# Patient Record
Sex: Female | Born: 1990 | Race: Black or African American | Hispanic: No | Marital: Single | Smoking: Never smoker
Health system: Southern US, Community
[De-identification: ages and names within clinical notes are randomized; demographics above are authoritative.]

## PROBLEM LIST (undated history)

## (undated) HISTORY — PX: NO PAST SURGERIES: SHX2092

---
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
## 2009-03-20 ENCOUNTER — Emergency Department (HOSPITAL_COMMUNITY): Admission: EM | Admit: 2009-03-20 | Discharge: 2009-03-20 | Payer: Self-pay | Admitting: Emergency Medicine

## 2009-07-05 ENCOUNTER — Emergency Department (HOSPITAL_COMMUNITY): Admission: EM | Admit: 2009-07-05 | Discharge: 2009-07-05 | Payer: Self-pay | Admitting: Emergency Medicine

## 2010-09-18 ENCOUNTER — Emergency Department (HOSPITAL_COMMUNITY)
Admission: EM | Admit: 2010-09-18 | Discharge: 2010-09-18 | Payer: Self-pay | Source: Home / Self Care | Admitting: Family Medicine

## 2011-01-07 LAB — URINALYSIS, ROUTINE W REFLEX MICROSCOPIC
Bilirubin Urine: NEGATIVE
Glucose, UA: NEGATIVE mg/dL
Hgb urine dipstick: NEGATIVE
Ketones, ur: NEGATIVE mg/dL
Nitrite: NEGATIVE
Protein, ur: NEGATIVE mg/dL
Specific Gravity, Urine: 1.025 (ref 1.005–1.030)
Urobilinogen, UA: 0.2 mg/dL (ref 0.0–1.0)
pH: 6.5 (ref 5.0–8.0)

## 2011-01-07 LAB — POCT I-STAT, CHEM 8
BUN: 9 mg/dL (ref 6–23)
Creatinine, Ser: 0.7 mg/dL (ref 0.4–1.2)
Hemoglobin: 13.9 g/dL (ref 12.0–15.0)
Potassium: 3.9 mEq/L (ref 3.5–5.1)
Sodium: 138 mEq/L (ref 135–145)

## 2011-01-07 LAB — POCT PREGNANCY, URINE: Preg Test, Ur: NEGATIVE

## 2012-02-24 ENCOUNTER — Emergency Department (HOSPITAL_COMMUNITY): Payer: BC Managed Care – PPO

## 2012-02-24 ENCOUNTER — Encounter (HOSPITAL_COMMUNITY): Payer: Self-pay

## 2012-02-24 ENCOUNTER — Emergency Department (HOSPITAL_COMMUNITY)
Admission: EM | Admit: 2012-02-24 | Discharge: 2012-02-24 | Disposition: A | Discharge status: Home / Self Care | Payer: BC Managed Care – PPO | Attending: Emergency Medicine | Admitting: Emergency Medicine

## 2012-02-24 DIAGNOSIS — R51 Headache: Secondary | ICD-10-CM | POA: Insufficient documentation

## 2012-02-24 DIAGNOSIS — IMO0002 Reserved for concepts with insufficient information to code with codable children: Secondary | ICD-10-CM | POA: Insufficient documentation

## 2012-02-24 DIAGNOSIS — S40812A Abrasion of left upper arm, initial encounter: Secondary | ICD-10-CM

## 2012-02-24 DIAGNOSIS — T07XXXA Unspecified multiple injuries, initial encounter: Secondary | ICD-10-CM | POA: Insufficient documentation

## 2012-02-24 DIAGNOSIS — M542 Cervicalgia: Secondary | ICD-10-CM | POA: Insufficient documentation

## 2012-02-24 DIAGNOSIS — M25519 Pain in unspecified shoulder: Secondary | ICD-10-CM | POA: Insufficient documentation

## 2012-02-24 MED ORDER — OXYCODONE-ACETAMINOPHEN 5-325 MG PO TABS
2.0000 | ORAL_TABLET | Freq: Once | ORAL | Status: AC
  Administered 2012-02-24: 2 via ORAL
  Filled 2012-02-24: qty 2

## 2012-02-24 MED ORDER — TETANUS-DIPHTH-ACELL PERTUSSIS 5-2.5-18.5 LF-MCG/0.5 IM SUSP
0.5000 mL | Freq: Once | INTRAMUSCULAR | Status: AC
  Administered 2012-02-24: 0.5 mL via INTRAMUSCULAR
  Filled 2012-02-24: qty 0.5

## 2012-02-24 MED ORDER — OXYCODONE-ACETAMINOPHEN 5-325 MG PO TABS: 1.0000 | ORAL_TABLET | ORAL | Status: AC | PRN
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
## 2012-02-24 NOTE — ED Notes (Signed)
Pt in radiology 
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
## 2019-04-11 ENCOUNTER — Ambulatory Visit: Payer: Self-pay | Admitting: Nurse Practitioner

## 2019-04-11 ENCOUNTER — Other Ambulatory Visit: Payer: Self-pay

## 2019-04-20 ENCOUNTER — Ambulatory Visit: Payer: Self-pay | Attending: Nurse Practitioner | Admitting: Nurse Practitioner

## 2019-04-20 ENCOUNTER — Encounter: Payer: Self-pay | Admitting: Nurse Practitioner

## 2019-04-20 ENCOUNTER — Other Ambulatory Visit: Payer: Self-pay

## 2019-04-20 ENCOUNTER — Other Ambulatory Visit: Payer: Self-pay | Admitting: Internal Medicine

## 2019-04-20 DIAGNOSIS — Z13220 Encounter for screening for lipoid disorders: Secondary | ICD-10-CM

## 2019-04-20 DIAGNOSIS — F17210 Nicotine dependence, cigarettes, uncomplicated: Secondary | ICD-10-CM

## 2019-04-20 DIAGNOSIS — Z7689 Persons encountering health services in other specified circumstances: Secondary | ICD-10-CM

## 2019-04-20 DIAGNOSIS — Z20822 Contact with and (suspected) exposure to covid-19: Secondary | ICD-10-CM

## 2019-04-20 DIAGNOSIS — Z130 Encounter for screening for diseases of the blood and blood-forming organs and certain disorders involving the immune mechanism: Secondary | ICD-10-CM

## 2019-04-20 NOTE — Progress Notes (Signed)
Virtual Visit via Telephone Note Due to national recommendations of social distancing due to [place] 19, telehealth visit is felt to be most appropriate for this patient at this time.  I discussed the limitations, risks, security and privacy concerns of performing an evaluation and management service by telephone and the availability of in person appointments. I also discussed with the patient that there may be a patient responsible charge related to this service. The patient expressed understanding and agreed to proceed.    I connected with Tiffany Kirk on 04/20/19  at   8:50 AM EDT  EDT by telephone and verified that I am speaking with the correct person using two identifiers.   Consent I discussed the limitations, risks, security and privacy concerns of performing an evaluation and management service by telephone and the availability of in person appointments. I also discussed with the patient that there may be a patient responsible charge related to this service. The patient expressed understanding and agreed to proceed.   Location of Patient: Private Residence   Location of Provider: [place] and [place] participating in Telemedicine visit: Geryl Rankins FNP-BC [place]    History of Present Illness: Telemedicine visit for: Establish Care  She denies any PMH. She has never had a PAP smear. Menstrual cycles are normal. She is a smoker and has been a smoker for 5 years. Not ready to quit.     History reviewed. No pertinent past medical history.  Past Surgical History:  Procedure Laterality Date  . NO PAST SURGERIES      Family History  Problem Relation Age of Onset  . Diabetes Neg Hx     Social History   Socioeconomic History  . Marital status: Single    Spouse name: Not on file  . Number of children: Not on file  . Years of education: Not on file  . Highest education level: Not on file  Occupational History  .  Not on file  Social Needs  . Financial resource strain: Not on file  . Food insecurity    Worry: Not on file    Inability: Not on file  . Transportation needs    Medical: Not on file    Non-medical: Not on file  Tobacco Use  . Smoking status: Current Every Day Smoker  . Smokeless tobacco: Never Used  Substance and Sexual Activity  . Alcohol use: Yes    Comment: occasionally   . Drug use: Not Currently  . Sexual activity: Yes  Lifestyle  . Physical activity    Days per week: Not on file    Minutes per session: Not on file  . Stress: Not on file  Relationships  . Social Herbalist on phone: Not on file    Gets together: Not on file    Attends religious service: Not on file    Active member of club or organization: Not on file    Attends meetings of clubs or organizations: Not on file    Relationship status: Not on file  Other Topics Concern  . Not on file  Social History Narrative  . Not on file     Observations/Objective: Awake, alert and oriented x 3   Review of Systems  Constitutional: Negative for fever, malaise/fatigue and weight loss.  HENT: Negative.  Negative for nosebleeds.   Eyes: Negative.  Negative for blurred vision, double vision and photophobia.  Respiratory: Negative.  Negative  for cough and shortness of breath.   Cardiovascular: Negative.  Negative for chest pain, palpitations and leg swelling.  Gastrointestinal: Negative.  Negative for heartburn, nausea and vomiting.  Musculoskeletal: Negative.  Negative for myalgias.  Neurological: Negative.  Negative for dizziness, focal weakness, seizures and headaches.  Psychiatric/Behavioral: Negative.  Negative for suicidal ideas.    Assessment and Plan: Tiffany Kirk was seen today for new patient (initial visit).  Diagnoses and all orders for this visit:  Encounter to establish care -     CMP14+EGFR  Encounter for screening for lipid disorder -     Lipid panel  Screening for deficiency anemia -      CBC     Follow Up Instructions Return for Physical and PAP.     I discussed the assessment and treatment plan with the patient. The patient was provided an opportunity to ask questions and all were answered. The patient agreed with the plan and demonstrated an understanding of the instructions.   The patient was advised to call back or seek an in-person evaluation if the symptoms worsen or if the condition fails to improve as anticipated.  I provided 15 minutes of non-face-to-face time during this encounter including median intraservice time, reviewing previous notes, labs, imaging, medications and explaining diagnosis and management.  Gildardo Pounds, FNP-BC

## 2019-04-26 LAB — NOVEL CORONAVIRUS, NAA: SARS-CoV-2, NAA: NOT DETECTED

## 2019-05-08 ENCOUNTER — Encounter: Payer: Self-pay | Admitting: Nurse Practitioner

## 2019-05-15 ENCOUNTER — Encounter: Payer: Self-pay | Admitting: Nurse Practitioner

## 2019-12-19 DIAGNOSIS — Z6841 Body Mass Index (BMI) 40.0 and over, adult: Secondary | ICD-10-CM | POA: Diagnosis not present

## 2019-12-19 DIAGNOSIS — R110 Nausea: Secondary | ICD-10-CM | POA: Diagnosis not present

## 2020-03-12 ENCOUNTER — Ambulatory Visit (INDEPENDENT_AMBULATORY_CARE_PROVIDER_SITE_OTHER): Payer: BC Managed Care – PPO

## 2020-03-12 ENCOUNTER — Other Ambulatory Visit: Payer: Self-pay

## 2020-03-12 ENCOUNTER — Ambulatory Visit: Payer: BC Managed Care – PPO | Admitting: Podiatry

## 2020-03-12 DIAGNOSIS — M2142 Flat foot [pes planus] (acquired), left foot: Secondary | ICD-10-CM

## 2020-03-12 DIAGNOSIS — M79673 Pain in unspecified foot: Secondary | ICD-10-CM

## 2020-03-12 DIAGNOSIS — M2141 Flat foot [pes planus] (acquired), right foot: Secondary | ICD-10-CM

## 2020-03-13 ENCOUNTER — Encounter: Payer: Self-pay | Admitting: Podiatry

## 2020-03-13 NOTE — Progress Notes (Signed)
Subjective:  Patient ID: Tiffany Kirk, female    DOB: 12-Feb-1991,  MRN: [id number]  Chief Complaint  Patient presents with  . Foot Pain    pt is here for bi lat flat feet, pt states that pain is elevated to the touch, pt also states that the pain has been going on for 3-5 years, pt also states that the pain is gradual.    29 y.o. female presents with the above complaint.  Patient presents with complaint of bilateral arch pain as well as heel pain that has been going on for quite some time.  Patient states that he feels generalized aches especially when she has been standing on her feet for long period of time.  Is been over 3 to 5 years has progressive gotten worse.  Is painful to walk on.  Pain is gradual.  There is aching and shooting and throbbing associated with it.  She states that she works 12-hour shifts.  She would like to know if there is any treatment options for this.  She has not seen anyone else prior to seeing me.  Her pain scale is 6 out of 10.  Is dull achy in nature   Review of Systems: Negative except as noted in the HPI. Denies N/V/F/Ch.  No past medical history on file. No current outpatient medications on file.  Social History   Tobacco Use  Smoking Status Current Every Day Smoker  Smokeless Tobacco Never Used    Allergies  Allergen Reactions  . Strawberry Flavor     Bumps on face   Objective:  There were no vitals filed for this visit. There is no height or weight on file to calculate BMI. Constitutional Well developed. Well nourished.  Vascular Dorsalis pedis pulses palpable bilaterally. Posterior tibial pulses palpable bilaterally. Capillary refill normal to all digits.  No cyanosis or clubbing noted. Pedal hair growth normal.  Neurologic Normal speech. Oriented to person, place, and time. Epicritic sensation to light touch grossly present bilaterally.  Dermatologic Nails well groomed and normal in appearance. No open wounds. No skin lesions.   Orthopedic:  Generalized pain noted across the arch of the foot as well as the plantar fascia origin.  No sharp shooting pain was palpated.  Unable to recreate the arch with dorsiflexion of the hallux.  Gait examination shows calcaneal valgus with too many toe signs unable to recreate the arch with dorsiflexion of the hallux.  Unable to supinate the calcaneus with single and double heel raises.   Radiographs: 3 views of skeletally mature adult foot bilateral: There is decreasing calcaneal inclination angle increase in talar declination angle anterior break in the cyma line osteoarthritic changes noted at the talonavicular joint mild elevatus of the first ray noted.  Moderate bunion deformity noted bilaterally.  No other abnormalities noted.  Assessment:   1. Bilateral pes planus   2. Arch pain, unspecified laterality    Plan:  Patient was evaluated and treated and all questions answered.  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  I explained to her that given her severe nature of the pes planus with huge demand on her feet from work hard, I believe patient will benefit from orthotics management to help support the arches of the foot as well as control the hindfoot motion.  Patient agrees with the plan would like to proceed with getting  orthotics. -If there is no resolve meant with orthotics will consider surgical intervention at that time. -Patient will be scheduled see Raiford Noble for custom-made orthotics  Return in about 1 week (around 03/19/2020) for Sched with Raiford Noble for [employer].

## 2020-03-26 ENCOUNTER — Other Ambulatory Visit: Payer: Self-pay

## 2020-03-26 ENCOUNTER — Other Ambulatory Visit: Payer: BC Managed Care – PPO | Admitting: Orthotics

## 2020-03-26 ENCOUNTER — Ambulatory Visit (INDEPENDENT_AMBULATORY_CARE_PROVIDER_SITE_OTHER): Payer: BC Managed Care – PPO | Admitting: Orthotics

## 2020-03-26 DIAGNOSIS — M79673 Pain in unspecified foot: Secondary | ICD-10-CM

## 2020-03-26 DIAGNOSIS — M2141 Flat foot [pes planus] (acquired), right foot: Secondary | ICD-10-CM | POA: Diagnosis not present

## 2020-03-26 DIAGNOSIS — M2142 Flat foot [pes planus] (acquired), left foot: Secondary | ICD-10-CM | POA: Diagnosis not present

## 2020-03-26 NOTE — Progress Notes (Signed)
Foot orthotics cast today for pes planovalgus RF deformity; seconardy over pronation.  Plan on deep heel cup/4* b/l kirby skive.

## 2020-04-18 ENCOUNTER — Other Ambulatory Visit: Payer: Self-pay

## 2020-04-18 ENCOUNTER — Encounter: Payer: BC Managed Care – PPO | Admitting: Orthotics

## 2020-04-23 ENCOUNTER — Other Ambulatory Visit: Payer: Self-pay | Admitting: Family Medicine

## 2020-04-23 ENCOUNTER — Other Ambulatory Visit (HOSPITAL_COMMUNITY)
Admission: RE | Admit: 2020-04-23 | Discharge: 2020-04-23 | Disposition: A | Discharge status: Home / Self Care | Payer: BC Managed Care – PPO | Source: Ambulatory Visit | Attending: Family Medicine | Admitting: Family Medicine

## 2020-04-23 DIAGNOSIS — Z01411 Encounter for gynecological examination (general) (routine) with abnormal findings: Secondary | ICD-10-CM | POA: Diagnosis present

## 2020-04-28 LAB — CYTOLOGY - PAP
Adequacy: ABSENT
Diagnosis: NEGATIVE

## 2020-06-13 ENCOUNTER — Ambulatory Visit: Payer: BC Managed Care – PPO | Admitting: Podiatry

## 2020-11-19 ENCOUNTER — Ambulatory Visit: Payer: BC Managed Care – PPO | Admitting: Podiatry

## 2020-11-19 ENCOUNTER — Other Ambulatory Visit: Payer: Self-pay

## 2020-11-19 ENCOUNTER — Encounter: Payer: Self-pay | Admitting: Podiatry

## 2020-11-19 ENCOUNTER — Ambulatory Visit (INDEPENDENT_AMBULATORY_CARE_PROVIDER_SITE_OTHER): Payer: BC Managed Care – PPO

## 2020-11-19 DIAGNOSIS — M76821 Posterior tibial tendinitis, right leg: Secondary | ICD-10-CM | POA: Diagnosis not present

## 2020-11-19 DIAGNOSIS — M2141 Flat foot [pes planus] (acquired), right foot: Secondary | ICD-10-CM

## 2020-11-19 DIAGNOSIS — M2142 Flat foot [pes planus] (acquired), left foot: Secondary | ICD-10-CM | POA: Diagnosis not present

## 2020-11-19 NOTE — Progress Notes (Signed)
Subjective:  Patient ID: Tiffany Kirk, female    DOB: 1991-05-01,  MRN: [id number]  Chief Complaint  Patient presents with  . Foot Pain    Bilateral foot pain due to pes planus. Pt. States pain has increased. Per. Pt. Orthotics are not helping with pain.    30 y.o. female presents with the above complaint.  Patient presents with complaint bilateral pes planovalgus with arch pain as well as inside medial tendon pain.  Patient states that he has been on and off getting little bit better but not really overall.  She states the orthotics are helping somewhat but she has not been very compliant with it.  She has been working more on her foot.  She has not been able to rest it.  She denies any other acute complaint she would like to discuss future treatment options.   Review of Systems: Negative except as noted in the HPI. Denies N/V/F/Ch.  No past medical history on file.  Current Outpatient Medications:  .  phentermine (ADIPEX-P) 37.5 MG tablet, 1 tablet, Disp: , Rfl:  .  phentermine (ADIPEX-P) 37.5 MG tablet, Take 37.5 mg by mouth daily., Disp: , Rfl:   Social History   Tobacco Use  Smoking Status Current Every Day Smoker  Smokeless Tobacco Never Used    Allergies  Allergen Reactions  . Strawberry Flavor     Bumps on face   Objective:  There were no vitals filed for this visit. There is no height or weight on file to calculate BMI. Constitutional Well developed. Well nourished.  Vascular Dorsalis pedis pulses palpable bilaterally. Posterior tibial pulses palpable bilaterally. Capillary refill normal to all digits.  No cyanosis or clubbing noted. Pedal hair growth normal.  Neurologic Normal speech. Oriented to person, place, and time. Epicritic sensation to light touch grossly present bilaterally.  Dermatologic Nails well groomed and normal in appearance. No open wounds. No skin lesions.  Orthopedic:  Generalized pain noted across the arch of the foot as well as the  plantar fascia origin.  No sharp shooting pain was palpated.  Unable to recreate the arch with dorsiflexion of the hallux.  Gait examination shows calcaneal valgus with too many toe signs unable to recreate the arch with dorsiflexion of the hallux.  Unable to supinate the calcaneus with single and double heel raises.  Pain along the course of the posterior tibial tendon.  Mild pain along the insertion of it into the navicular.  Pain with resisted dorsiflexion inversion of the foot.  No pain with dorsiflexion eversion of the foot.  No pain at the Achilles tendon, peroneal tendon, ATFL ligament.   Radiographs: 3 views of skeletally mature adult foot bilateral: There is decreasing calcaneal inclination angle increase in talar declination angle anterior break in the cyma line osteoarthritic changes noted at the talonavicular joint mild elevatus of the first ray noted.  Moderate bunion deformity noted bilaterally.  No other abnormalities noted.  Assessment:   1. Bilateral pes planus    Plan:  Patient was evaluated and treated and all questions answered.  Bilateral posterior tibial tendinitis right greater than left with underlying pes planovalgus deformity -I explained to patient the etiology of arch pain and various treatment options were discussed in the setting of pes planovalgus foot deformity.  I also explained to her the etiology of posterior tibial tendinitis with this relationship of pes planovalgus.  She states that she has not been very compliant with the custom orthotics.  I encouraged compliancy with the  orthotics she states understanding and will do so. -Given the amount of pain she is having I believe patient will benefit from evaluation with an MRI to rule out posterior tibial tendinitis.  Patient agrees with the plan would like to proceed with getting an MRI.  We will start off with the right side for now.  No follow-ups on file.

## 2020-11-25 ENCOUNTER — Other Ambulatory Visit: Payer: Self-pay

## 2020-11-25 ENCOUNTER — Ambulatory Visit
Admission: RE | Admit: 2020-11-25 | Discharge: 2020-11-25 | Disposition: A | Discharge status: Home / Self Care | Payer: BC Managed Care – PPO | Source: Ambulatory Visit | Attending: Podiatry | Admitting: Podiatry

## 2020-11-25 DIAGNOSIS — M76821 Posterior tibial tendinitis, right leg: Secondary | ICD-10-CM

## 2020-12-17 ENCOUNTER — Ambulatory Visit: Payer: BC Managed Care – PPO | Admitting: Podiatry

## 2020-12-26 DIAGNOSIS — R5382 Chronic fatigue, unspecified: Secondary | ICD-10-CM | POA: Diagnosis not present

## 2021-01-09 ENCOUNTER — Other Ambulatory Visit: Payer: Self-pay

## 2021-01-09 ENCOUNTER — Encounter: Payer: Self-pay | Admitting: Podiatry

## 2021-01-09 ENCOUNTER — Ambulatory Visit: Payer: BC Managed Care – PPO | Admitting: Podiatry

## 2021-01-09 DIAGNOSIS — M2142 Flat foot [pes planus] (acquired), left foot: Secondary | ICD-10-CM

## 2021-01-09 DIAGNOSIS — M2141 Flat foot [pes planus] (acquired), right foot: Secondary | ICD-10-CM

## 2021-01-09 DIAGNOSIS — M76821 Posterior tibial tendinitis, right leg: Secondary | ICD-10-CM

## 2021-01-13 ENCOUNTER — Encounter: Payer: Self-pay | Admitting: Podiatry

## 2021-01-13 NOTE — Progress Notes (Signed)
Subjective:  Patient ID: Tiffany Kirk, female    DOB: [birth year],  MRN: [id number]  Chief Complaint  Patient presents with  . Foot Pain    MRI results     30 y.o. female presents with the above complaint.  Patient presents for follow-up of bilateral severe pes planovalgus with arch pain as well as posterior tibial tendinitis.  Right greater than left.  She states that the orthotics helps a little bit.  However she still continues to have pain.  She is here to go over the MRI results and discuss future treatment options.   Review of Systems: Negative except as noted in the HPI. Denies N/V/F/Ch.  No past medical history on file.  Current Outpatient Medications:  .  phentermine (ADIPEX-P) 37.5 MG tablet, Take 37.5 mg by mouth daily., Disp: , Rfl:  .  phentermine (ADIPEX-P) 37.5 MG tablet, 1 tablet, Disp: , Rfl:   Social History   Tobacco Use  Smoking Status Current Every Day Smoker  Smokeless Tobacco Never Used    Allergies  Allergen Reactions  . Strawberry Flavor     Bumps on face   Objective:  There were no vitals filed for this visit. There is no height or weight on file to calculate BMI. Constitutional Well developed. Well nourished.  Vascular Dorsalis pedis pulses palpable bilaterally. Posterior tibial pulses palpable bilaterally. Capillary refill normal to all digits.  No cyanosis or clubbing noted. Pedal hair growth normal.  Neurologic Normal speech. Oriented to person, place, and time. Epicritic sensation to light touch grossly present bilaterally.  Dermatologic Nails well groomed and normal in appearance. No open wounds. No skin lesions.  Orthopedic:  Generalized pain noted across the arch of the foot as well as the plantar fascia origin.  No sharp shooting pain was palpated.  Unable to recreate the arch with dorsiflexion of the hallux.  Gait examination shows calcaneal valgus with too many toe signs unable to recreate the arch with dorsiflexion of the  hallux.  Unable to supinate the calcaneus with single and double heel raises.  Pain along the course of the posterior tibial tendon.  Mild pain along the insertion of it into the navicular.  Pain with resisted dorsiflexion inversion of the foot.  No pain with dorsiflexion eversion of the foot.  No pain at the Achilles tendon, peroneal tendon, ATFL ligament.   Radiographs: 3 views of skeletally mature adult foot bilateral: There is decreasing calcaneal inclination angle increase in talar declination angle anterior break in the cyma line osteoarthritic changes noted at the talonavicular joint mild elevatus of the first ray noted.  Moderate bunion deformity noted bilaterally.  No other abnormalities noted.  Moderate lateral hindfoot valgus deformity with associated marrow edema in the lateral process of the talus and subjacent calcaneus.  Findings consistent with os trigonum syndrome.  Mild edema about the distal tibialis posterior tendon compatible with tendinosis. No tear.  Assessment:   1. Bilateral pes planus   2. Posterior tibial tendinitis of right leg    Plan:  Patient was evaluated and treated and all questions answered.  Bilateral posterior tibial tendinitis right greater than left with underlying pes planovalgus deformity -I explained to patient the etiology of arch pain and various treatment options were discussed in the setting of pes planovalgus foot deformity.  I also explained to her the etiology of posterior tibial tendinitis with this relationship of pes planovalgus.  She states that she has not been very compliant with the custom orthotics.  I  encouraged compliancy with the orthotics she states understanding and will do so. -MRI was discussed with the patient in extensive detail.  I discussed with the patient that although the results of the MRI were pointing towards changes secondary to severe pes planovalgus deformity.  I briefly discussed my surgical planning as well as  plan to essentially reconstruct the flatfoot.  Given the changes that she is having with hindfoot valgus deformity with pain due to os trigonum syndrome.  Pain in the arch of the foot.  At this time I believe patient is an ideal candidate for flatfoot reconstruction.  I discussed with her that it would take about 3 months of recovery.  For now patient would like to think about it.  I will see her back again in few weeks to reassess and see if she is able to undergo the surgery or not.  Ultimately believe she will benefit from this as she is already started to get changes in her MRI secondary to severe hindfoot valgus. No follow-ups on file.

## 2021-01-22 ENCOUNTER — Encounter: Payer: Self-pay | Admitting: Podiatry

## 2021-01-22 ENCOUNTER — Telehealth: Payer: Self-pay | Admitting: Podiatry

## 2021-01-22 NOTE — Telephone Encounter (Signed)
Patient is requesting that her office note form 01/09/21 be re faxed to her job stating the next appointment day. Patient stated her employer needed to know the date of next appointment. Fax # [phone]

## 2021-01-22 NOTE — Telephone Encounter (Signed)
Done

## 2021-01-22 NOTE — Telephone Encounter (Signed)
That is fine you can send it over

## 2021-02-25 ENCOUNTER — Ambulatory Visit: Payer: Self-pay | Admitting: Podiatry

## 2021-03-20 DIAGNOSIS — R5382 Chronic fatigue, unspecified: Secondary | ICD-10-CM | POA: Diagnosis not present

## 2021-08-03 DIAGNOSIS — R5382 Chronic fatigue, unspecified: Secondary | ICD-10-CM | POA: Diagnosis not present

## 2021-11-10 DIAGNOSIS — E782 Mixed hyperlipidemia: Secondary | ICD-10-CM | POA: Diagnosis not present

## 2021-11-10 DIAGNOSIS — E119 Type 2 diabetes mellitus without complications: Secondary | ICD-10-CM | POA: Diagnosis not present

## 2021-11-10 DIAGNOSIS — Z0000 Encounter for general adult medical examination without abnormal findings: Secondary | ICD-10-CM | POA: Diagnosis not present

## 2021-11-10 DIAGNOSIS — I10 Essential (primary) hypertension: Secondary | ICD-10-CM | POA: Diagnosis not present

## 2021-11-10 DIAGNOSIS — E038 Other specified hypothyroidism: Secondary | ICD-10-CM | POA: Diagnosis not present

## 2021-11-10 DIAGNOSIS — E559 Vitamin D deficiency, unspecified: Secondary | ICD-10-CM | POA: Diagnosis not present

## 2021-11-10 DIAGNOSIS — R5382 Chronic fatigue, unspecified: Secondary | ICD-10-CM | POA: Diagnosis not present

## 2021-11-10 DIAGNOSIS — D518 Other vitamin B12 deficiency anemias: Secondary | ICD-10-CM | POA: Diagnosis not present

## 2021-11-14 DIAGNOSIS — E559 Vitamin D deficiency, unspecified: Secondary | ICD-10-CM | POA: Diagnosis not present

## 2022-03-16 DIAGNOSIS — R5382 Chronic fatigue, unspecified: Secondary | ICD-10-CM | POA: Diagnosis not present

## 2022-04-12 IMAGING — MR MR ANKLE*R* W/O CM
5 series · 40 of 40 positions shown · non-contrast
Comparison: None.

CLINICAL DATA: Medial right ankle pain.  Question PTT tendinosis.

EXAM:
MRI OF THE RIGHT ANKLE WITHOUT CONTRAST
TECHNIQUE: Multiplanar, multisequence MR imaging of the ankle was performed. No
intravenous contrast was administered.

[Series 4: T2 fat-sat · axial · 3.0mm · 0.62mm/px · z∈[-59,+92]mm · 9 of 39 slices shown (1 of 2)]
[im 1/39]
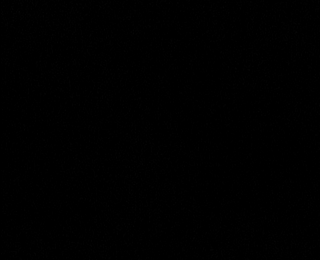
[im 5/39]
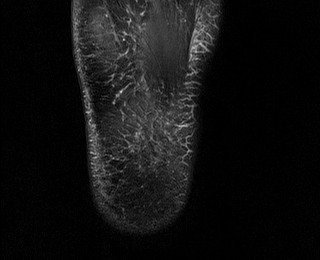
[im 10/39]
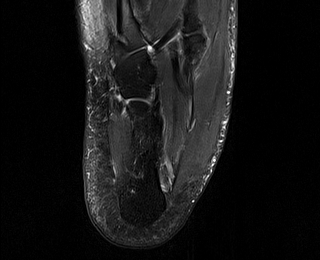
[im 15/39]
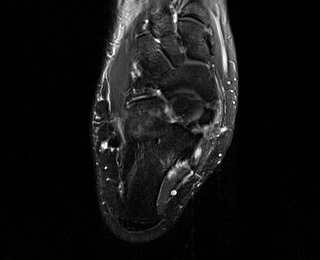
[im 20/39]
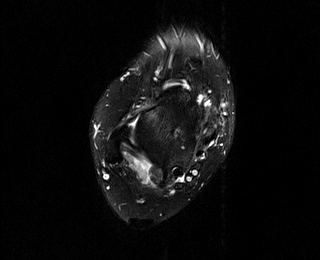
[im 24/39]
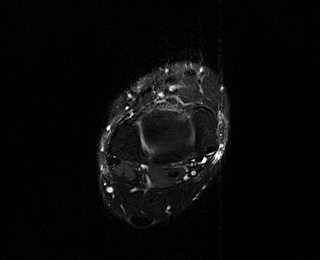
[im 29/39]
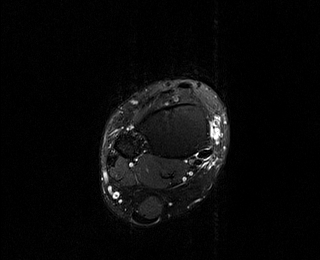
[im 34/39]
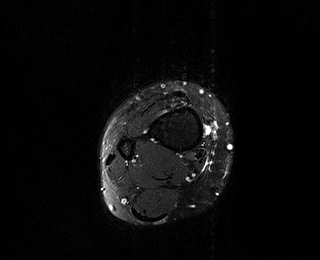
[im 39/39]
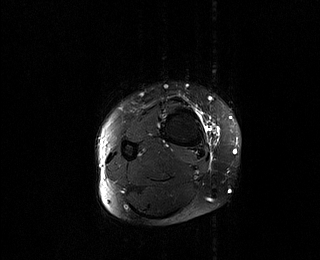

[Series 5: PD fat-sat · axial · 3.0mm · 0.62mm/px · z∈[-59,+92]mm · 9 of 39 slices shown]
[im 1/39]
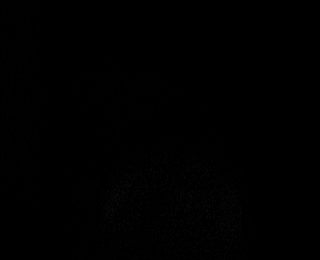
[im 5/39]
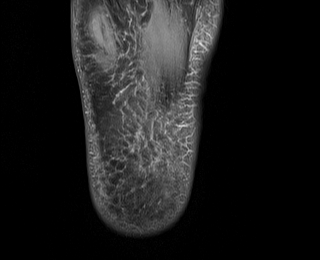
[im 10/39]
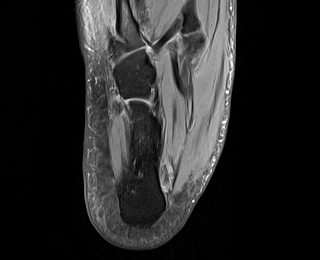
[im 15/39]
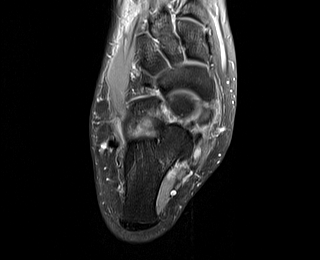
[im 20/39]
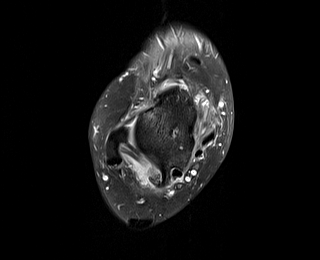
[im 24/39]
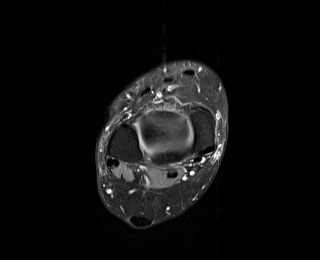
[im 29/39]
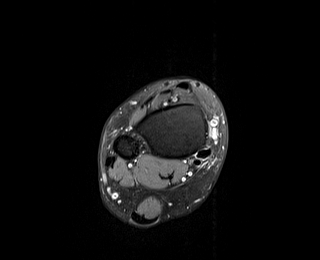
[im 34/39]
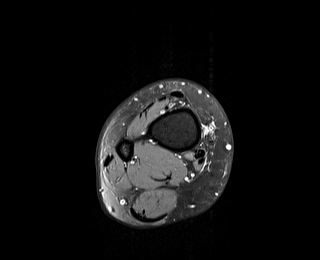
[im 39/39]
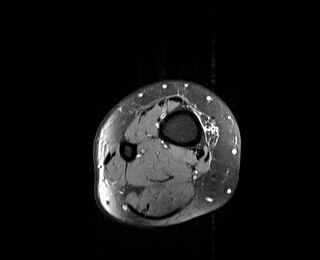

[Series 6: T2 fat-sat · coronal · 3.0mm · 0.62mm/px · 10 of 44 slices shown (2 of 2)]
[im 1/44]
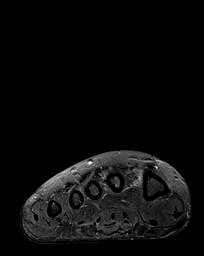
[im 5/44]
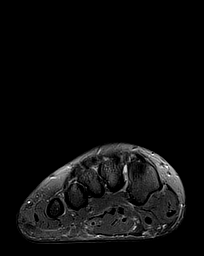
[im 10/44]
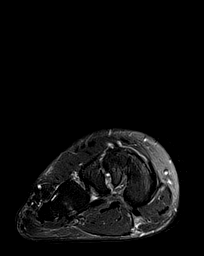
[im 15/44]
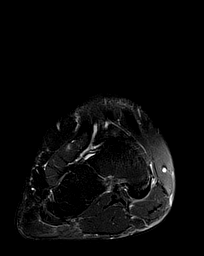
[im 20/44]
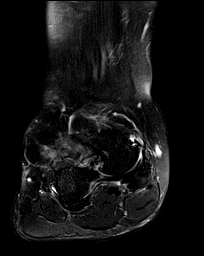
[im 24/44]
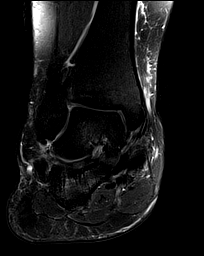
[im 29/44]
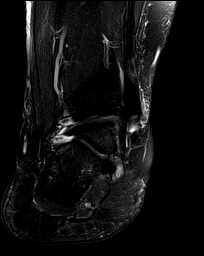
[im 34/44]
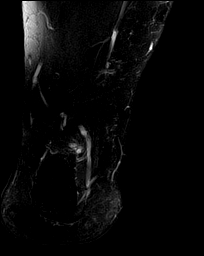
[im 39/44]
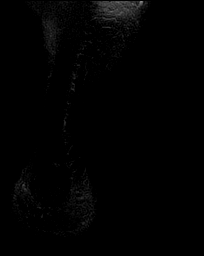
[im 44/44]
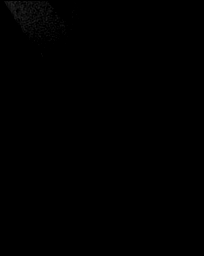

[Series 7: T1 · sagittal · 4.0mm · 0.56mm/px · 6 of 24 slices shown]
[im 1/24]
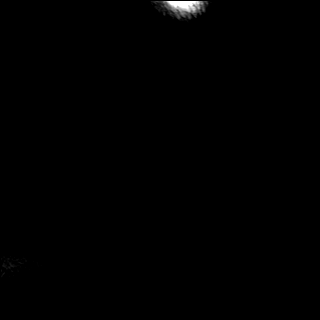
[im 5/24]
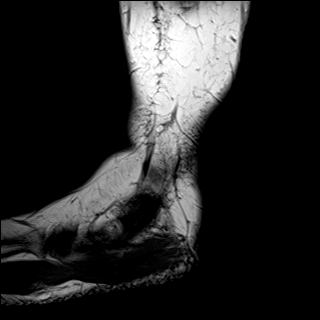
[im 10/24]
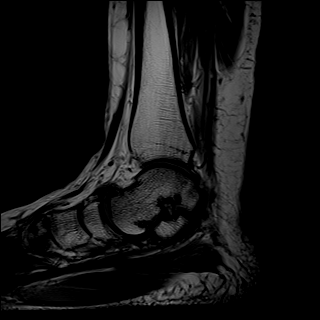
[im 14/24]
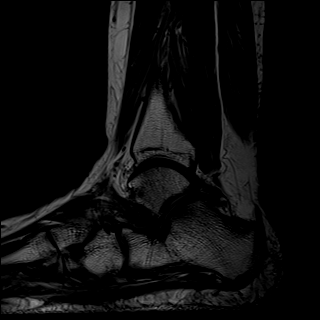
[im 19/24]
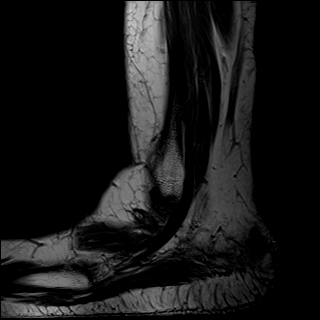
[im 24/24]
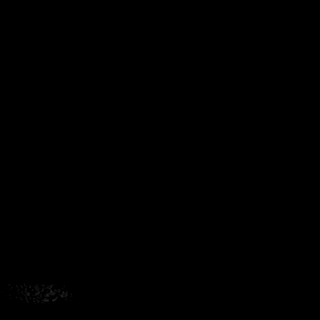

[Series 8: STIR · sagittal · 4.0mm · 0.56mm/px · 6 of 24 slices shown]
[im 1/24]
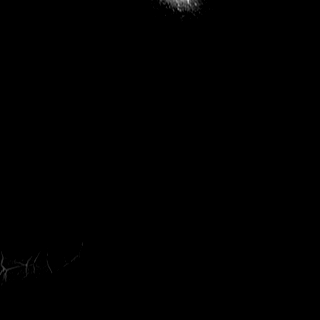
[im 5/24]
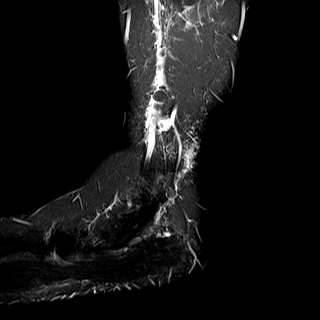
[im 10/24]
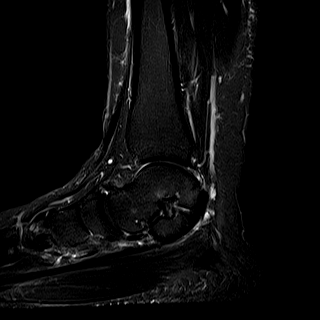
[im 14/24]
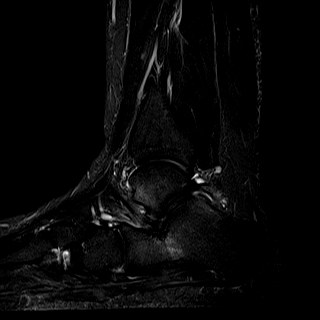
[im 19/24]
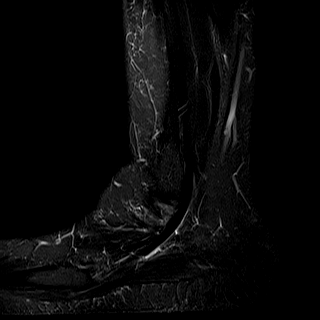
[im 24/24]
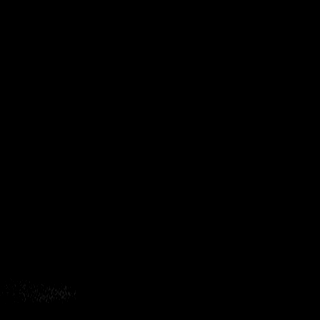

[40 of 40 positions shown; findings below may reference images not displayed]

FINDINGS: TENDONS

Peroneal: Intact.

Posteromedial: Intact. Mild soft tissue edema is seen about the
distal tibialis posterior. No intrasubstance signal within the
tendon.

Anterior: Intact.

Achilles: Intact.

Plantar Fascia: Intact.  No evidence of plantar fasciitis.

LIGAMENTS

Lateral: Intact.

Medial: Intact.

CARTILAGE

Ankle Joint: No osteochondral lesion of the talar dome or joint
effusion.

Subtalar Joints/Sinus Tarsi: Normal.

Bones: The patient has an os trigonum measuring 1.3 cm AP x 0.5 cm
craniocaudal x 1.2 cm transverse. There is edema and cystic change
in the os at the synchondrosis. Also seen is edema in the lateral
process of the talus and subjacent calcaneus. Lateral hindfoot angle
measures 22 degrees consistent with moderate valgus deformity.
Scattered degenerative change appears mild-to-moderate worst at the
talonavicular joint. No fracture.

Other: None.
IMPRESSION: Moderate lateral hindfoot valgus deformity with associated marrow
edema in the lateral process of the talus and subjacent calcaneus.

Findings consistent with os trigonum syndrome.

Mild edema about the distal tibialis posterior tendon compatible
with tendinosis. No tear.

## 2022-05-28 DIAGNOSIS — M25562 Pain in left knee: Secondary | ICD-10-CM | POA: Diagnosis not present

## 2022-06-03 DIAGNOSIS — M25562 Pain in left knee: Secondary | ICD-10-CM | POA: Diagnosis not present

## 2023-06-07 DIAGNOSIS — E782 Mixed hyperlipidemia: Secondary | ICD-10-CM | POA: Diagnosis not present

## 2023-06-07 DIAGNOSIS — F39 Unspecified mood [affective] disorder: Secondary | ICD-10-CM | POA: Diagnosis not present

## 2023-06-07 DIAGNOSIS — Z23 Encounter for immunization: Secondary | ICD-10-CM | POA: Diagnosis not present

## 2023-06-07 DIAGNOSIS — R7301 Impaired fasting glucose: Secondary | ICD-10-CM | POA: Diagnosis not present

## 2023-06-07 DIAGNOSIS — Z79899 Other long term (current) drug therapy: Secondary | ICD-10-CM | POA: Diagnosis not present

## 2023-06-10 ENCOUNTER — Ambulatory Visit (HOSPITAL_COMMUNITY)
Admission: EM | Admit: 2023-06-10 | Discharge: 2023-06-10 | Disposition: A | Discharge status: Home / Self Care | Payer: BC Managed Care – PPO

## 2023-06-10 DIAGNOSIS — R4589 Other symptoms and signs involving emotional state: Secondary | ICD-10-CM

## 2023-06-10 DIAGNOSIS — R454 Irritability and anger: Secondary | ICD-10-CM | POA: Diagnosis not present

## 2023-06-10 DIAGNOSIS — Z7689 Persons encountering health services in other specified circumstances: Secondary | ICD-10-CM

## 2023-06-10 NOTE — Discharge Instructions (Addendum)
Discharge recommendations:   Outpatient Follow up: Please review list of outpatient resources for psychiatry and counseling. Please follow up with your primary care provider for all medical related needs.   Therapy: We recommend that patient participate in individual therapy to address mental health concerns.  Safety:   The following safety precautions should be taken:   No sharp objects. This includes scissors, razors, scrapers, and putty knives.   Chemicals should be removed and locked up.   Medications should be removed and locked up.   Weapons should be removed and locked up. This includes firearms, knives and instruments that can be used to cause injury.   The patient should abstain from use of illicit substances/drugs and abuse of any medications.  If symptoms worsen or do not continue to improve or if the patient becomes actively suicidal or homicidal then it is recommended that the patient return to the closest hospital emergency department, the [facility], or call 911 for further evaluation and treatment. National Suicide Prevention Lifeline 1-800-SUICIDE or 757-872-0647.  About 988 988 offers 24/7 access to trained crisis counselors who can help people experiencing mental health-related distress. People can call or text 988 or chat 988lifeline.org for themselves or if they are worried about a loved one who may need crisis support.   Please contact one of the following facilities to start medication management and therapy services:   [facility] at [facility] [address] [place] [address]  [place], [place] [address] [phone]   [facility]  [address] [place], [place] [address] [phone]  [facility] [place]  [address]ella Raring [place], [place] [address] [phone]  [facility]  [address]  [place], [place] [address] [phone]  [facility]  [address] [place], [place] [address] [phone]  [facility]  [address]enee Rival  [place], [place] [address] [phone]  [facility], [facility]  [place], Clinical Social Work/Therapist, MSW, [place], [place] [phone]

## 2023-06-10 NOTE — Progress Notes (Signed)
   06/10/23 0701  [facility] Triage Screening (Walk-ins at [facility] only)  How Did You Hear About [place]? Self  What Is the Reason for Your Visit/Call Today? Pt presents to [facility] accompanied by herself. Pt states, "I went to my doctor and I feel like I am Bipolar." Pt reports that she has inconsistent mood swings and is uncertin on what to do about this potential diagnosis. Pt reports her mood has been fluctuating for a long time now. Pt reports to be on no medication at this time. Pt reports her family has a hx of Bipolar. Pt is here at [facility] today seeking additional resources for her mood swings and wants to know for sure if she has this disorder. Pt denies substance use, SI, HI and AVH.  How Long Has This Been Causing You Problems? > than 6 months  Have You Recently Had Any Thoughts About Hurting Yourself? No  Are You Planning to Commit Suicide/Harm Yourself At This time? No  Have you Recently Had Thoughts About Hurting Someone Karolee Ohs? No  Are You Planning To Harm Someone At This Time? No  Are you currently experiencing any auditory, visual or other hallucinations? No  Have You Used Any Alcohol or Drugs in the Past 24 Hours? No  Do you have any current medical co-morbidities that require immediate attention? No  Clinician description of patient physical appearance/behavior: coherent, cooperative  What Do You Feel Would Help You the Most Today? Stress Management;Medication(s)  If access to [facility] was not available, would you have sought care in the Emergency Department? No  Determination of Need Routine (7 days)  Options For Referral Medication Management

## 2023-06-10 NOTE — ED Provider Notes (Signed)
Behavioral Health Urgent Care Medical Screening Exam  Patient Name: Tiffany Kirk MRN: [id number] Date of Evaluation: 06/10/23 Diagnosis:  Final diagnoses:  Encounter for psychiatric assessment    History of Present illness: Tiffany Kirk is a 32 y.o. female patient with no past psychiatric history who presents to the [facility] behavioral health urgent care voluntary unaccompanied for a psychiatric evaluation for concerns of possible bipolar.   Patient seen and evaluated face-to-face by this provider, and chart reviewed. Per chart review, patient has no psychiatric history on file.  On evaluation, patient is alert and oriented x 4. Her thought process is linear and speech is clear and coherent. Her mood is euthymic and affect is congruent. She denies SI/HI/AVH. There is no objective evidence that the patient is currently responding to internal or external stimuli. She is calm and cooperative and does not appear to be in acute distress.  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 She states that she was recommended to come for an evaluation by her primary care physician Dr. Chanetta Marshall at [facility]. She states that she recently met with Dr. Chanetta Marshall to discuss weight loss options. She states that her concerns today are episodes of anger and mood swings. She states that she gets triggered easily by little things which makes her angry, such as bumping into a door and getting mad and punching something or not liking when people stare at her. She states that she has a habit of shutting down and staying to herself when she is triggered. She states that she feels okay when she is not triggered. She describes her mood as "feel better today" no depressive, anxiety or manic symptoms. She denies a past manic episodes lasting for 7 days. She reports fair sleep. She reports a normal appetite. She scored a 5 on the  Mood Disorder questionnaire which does not further warrant additional screening for bipolar. She denies a past psychiatric history. No past inpatient or outpatient psychiatric treatments. She reports a family history of mother and brother dx with bipolar. She reports daily marijuana use. She reports drinking alcohol socially. She resides with her grandmother. She denies access to firearms. She works full-time for a plant. Patient's b/p elevated on exam 139/108. Repeat b/p 136/108. Patient denies a history for HTN or cardiac history. Patient denies taking prescribed medications. Patient is asymptomatic. I discussed with the patient to discuss elevated b/p with her PCP and if she develops SOB, chest pain, tingling in extremities, or persistent headaches to call 911 or go to the nearest ED.    Plan of care: Patient is recommended to follow up with outpatient therapy to address mental health concerns. Patient will need to follow up with a psychologist if she is required psychological testing for weight loss procedures.   Flowsheet Row ED from 06/10/2023 in [facility]  C-SSRS RISK CATEGORY No Risk       Psychiatric Specialty Exam  Presentation  General Appearance:Appropriate for Environment  Eye Contact:Fair  Speech:Clear and Coherent  Speech Volume:Normal  Handedness:No data recorded  Mood and Affect  Mood: Euthymic  Affect: Congruent   Thought Process  Thought Processes: Coherent  Descriptions of Associations:Intact  Orientation:Full (Time, Place and Person)  Thought Content:Logical    Hallucinations:None  Ideas of Reference:None  Suicidal Thoughts:No  Homicidal Thoughts:No   Sensorium  Memory: Immediate Fair; Recent Fair; Remote Fair  Judgment: Fair  Insight: Fair   Chartered certified accountant: Fair  Attention Span: Fair  Recall: [employer] of Knowledge: Fair  Language: Fair   Psychomotor Activity   Psychomotor Activity: Normal   Assets  Assets: Manufacturing systems engineer; Desire for Improvement; Financial Resources/Insurance; Housing; Leisure Time; Scientist, research (life sciences); Transportation; Resilience; Social Support   Sleep  Sleep: Fair  Number of hours:  6   Physical Exam: Physical Exam HENT:     Head: Normocephalic.     Nose: Nose normal.  Eyes:     Conjunctiva/sclera: Conjunctivae normal.  Cardiovascular:     Rate and Rhythm: Normal rate.     Comments: Hypertensive  Pulmonary:     Effort: Pulmonary effort is normal.  Musculoskeletal:        General: Normal range of motion.     Cervical back: Normal range of motion.  Neurological:     Mental Status: She is alert and oriented to person, place, and time.    Review of Systems  Constitutional: Negative.   HENT: Negative.    Eyes: Negative.   Respiratory: Negative.    Cardiovascular: Negative.   Gastrointestinal: Negative.   Genitourinary: Negative.   Musculoskeletal: Negative.   Endo/Heme/Allergies: Negative.    Blood pressure (!) 139/108, pulse 90, temperature 98.6 F (37 C), temperature source Oral, resp. rate 19, SpO2 100%. There is no height or weight on file to calculate BMI.  Musculoskeletal: Strength & Muscle Tone: within normal limits Gait & Station: normal Patient leans: N/A   [facility] MSE Discharge Disposition for Follow up and Recommendations: Based on my evaluation the patient does not appear to have an emergency medical condition and can be discharged with resources and follow up care in outpatient services for Individual Therapy   Outpatient Follow up: Please review list of outpatient resources for psychiatry and counseling. Please follow up with your primary care provider for all medical related needs.   Therapy: We recommend that patient participate in individual therapy to address mental health concerns.  Safety:   The following safety precautions should be taken:   No sharp objects. This  includes scissors, razors, scrapers, and putty knives.   Chemicals should be removed and locked up.   Medications should be removed and locked up.   Weapons should be removed and locked up. This includes firearms, knives and instruments that can be used to cause injury.   The patient should abstain from use of illicit substances/drugs and abuse of any medications.  If symptoms worsen or do not continue to improve or if the patient becomes actively suicidal or homicidal then it is recommended that the patient return to the closest hospital emergency department, the [facility], or call 911 for further evaluation and treatment. National Suicide Prevention Lifeline 1-800-SUICIDE or (410)559-0077.  About 988 988 offers 24/7 access to trained crisis counselors who can help people experiencing mental health-related distress. People can call or text 988 or chat 988lifeline.org for themselves or if they are worried about a loved one who may need crisis support.   Please contact one of the following facilities to start medication management and therapy services:   [facility] at [facility] [address] [place] [address]  [place], [place] [address] [phone]   [facility]  [address] [place], [place] [address] [phone]  [facility] [place]  [address]ella Raring [place], [place] [address] [phone]  [place]  [phone]  [place], [place] [address] [phone]  [facility]  [address] [place], [place] [address] [phone]  [facility]  [address]enee Rival  [place], [place] [address] [phone]  [facility], [facility]  [place], Clinical Social Work/Therapist, MSW, [place], [place] [phone]  Layla Barter, NP 06/10/2023, 8:06 AM

## 2023-10-16 DIAGNOSIS — R0600 Dyspnea, unspecified: Secondary | ICD-10-CM | POA: Diagnosis not present

## 2023-10-16 DIAGNOSIS — R9431 Abnormal electrocardiogram [ECG] [EKG]: Secondary | ICD-10-CM | POA: Diagnosis not present

## 2023-10-16 DIAGNOSIS — R1013 Epigastric pain: Secondary | ICD-10-CM | POA: Diagnosis not present

## 2023-10-20 ENCOUNTER — Emergency Department (HOSPITAL_BASED_OUTPATIENT_CLINIC_OR_DEPARTMENT_OTHER)
Admission: EM | Admit: 2023-10-20 | Discharge: 2023-10-20 | Disposition: A | Discharge status: Home / Self Care | Payer: BC Managed Care – PPO | Attending: Emergency Medicine | Admitting: Emergency Medicine

## 2023-10-20 ENCOUNTER — Emergency Department (HOSPITAL_BASED_OUTPATIENT_CLINIC_OR_DEPARTMENT_OTHER): Payer: BC Managed Care – PPO

## 2023-10-20 ENCOUNTER — Other Ambulatory Visit: Payer: Self-pay

## 2023-10-20 ENCOUNTER — Encounter (HOSPITAL_BASED_OUTPATIENT_CLINIC_OR_DEPARTMENT_OTHER): Payer: Self-pay | Admitting: Emergency Medicine

## 2023-10-20 ENCOUNTER — Other Ambulatory Visit (HOSPITAL_BASED_OUTPATIENT_CLINIC_OR_DEPARTMENT_OTHER): Payer: Self-pay

## 2023-10-20 DIAGNOSIS — R109 Unspecified abdominal pain: Secondary | ICD-10-CM | POA: Diagnosis not present

## 2023-10-20 DIAGNOSIS — R1011 Right upper quadrant pain: Secondary | ICD-10-CM | POA: Diagnosis not present

## 2023-10-20 DIAGNOSIS — K828 Other specified diseases of gallbladder: Secondary | ICD-10-CM | POA: Diagnosis not present

## 2023-10-20 DIAGNOSIS — R9431 Abnormal electrocardiogram [ECG] [EKG]: Secondary | ICD-10-CM | POA: Diagnosis not present

## 2023-10-20 DIAGNOSIS — K8020 Calculus of gallbladder without cholecystitis without obstruction: Secondary | ICD-10-CM | POA: Insufficient documentation

## 2023-10-20 DIAGNOSIS — R1013 Epigastric pain: Secondary | ICD-10-CM | POA: Diagnosis not present

## 2023-10-20 DIAGNOSIS — K838 Other specified diseases of biliary tract: Secondary | ICD-10-CM | POA: Diagnosis not present

## 2023-10-20 LAB — COMPREHENSIVE METABOLIC PANEL
ALT: 21 U/L (ref 0–44)
AST: 21 U/L (ref 15–41)
Albumin: 4.2 g/dL (ref 3.5–5.0)
Alkaline Phosphatase: 57 U/L (ref 38–126)
Anion gap: 8 (ref 5–15)
BUN: 7 mg/dL (ref 6–20)
CO2: 26 mmol/L (ref 22–32)
Calcium: 9.3 mg/dL (ref 8.9–10.3)
Chloride: 102 mmol/L (ref 98–111)
Creatinine, Ser: 0.72 mg/dL (ref 0.44–1.00)
GFR, Estimated: >60 mL/min (ref >60)
Glucose, Bld: 112 mg/dL — ABNORMAL HIGH (ref 70–99)
Potassium: 4.2 mmol/L (ref 3.5–5.1)
Sodium: 136 mmol/L (ref 135–145)
Total Bilirubin: 0.4 mg/dL (ref 0.0–1.2)
Total Protein: 7.1 g/dL (ref 6.5–8.1)

## 2023-10-20 LAB — LIPASE, BLOOD: Lipase: 22 U/L (ref 11–51)

## 2023-10-20 LAB — CBC
HCT: 41.7 % (ref 36.0–46.0)
Hemoglobin: 13.9 g/dL (ref 12.0–15.0)
MCH: 26.9 pg (ref 26.0–34.0)
MCHC: 33.3 g/dL (ref 30.0–36.0)
MCV: 80.7 fL (ref 80.0–100.0)
Platelets: 419 10*3/uL — ABNORMAL HIGH (ref 150–400)
RBC: 5.17 MIL/uL — ABNORMAL HIGH (ref 3.87–5.11)
RDW: 12.9 % (ref 11.5–15.5)
WBC: 10.1 10*3/uL (ref 4.0–10.5)
nRBC: 0 % (ref 0.0–0.2)

## 2023-10-20 LAB — URINALYSIS, ROUTINE W REFLEX MICROSCOPIC
Bilirubin Urine: NEGATIVE
Glucose, UA: NEGATIVE mg/dL
Hgb urine dipstick: NEGATIVE
Leukocytes,Ua: NEGATIVE
Nitrite: NEGATIVE
Protein, ur: NEGATIVE mg/dL
Specific Gravity, Urine: 1.021 (ref 1.005–1.030)
pH: 6.5 (ref 5.0–8.0)

## 2023-10-20 LAB — PREGNANCY, URINE: Preg Test, Ur: NEGATIVE

## 2023-10-20 MED ORDER — ONDANSETRON HCL 4 MG/2ML IJ SOLN
4.0000 mg | Freq: Once | INTRAMUSCULAR | Status: AC
  Administered 2023-10-20: 4 mg via INTRAVENOUS
  Filled 2023-10-20: qty 2

## 2023-10-20 MED ORDER — MORPHINE SULFATE (PF) 4 MG/ML IV SOLN
4.0000 mg | Freq: Once | INTRAVENOUS | Status: AC
  Administered 2023-10-20: 4 mg via INTRAVENOUS
  Filled 2023-10-20: qty 1

## 2023-10-20 MED ORDER — MORPHINE SULFATE (PF) 4 MG/ML IV SOLN
4.0000 mg | Freq: Once | INTRAVENOUS | Status: AC
Start: 2023-10-20 — End: 2023-10-20
  Administered 2023-10-20: 4 mg via INTRAVENOUS
  Filled 2023-10-20: qty 1

## 2023-10-20 MED ORDER — PANTOPRAZOLE SODIUM 40 MG PO TBEC
40.0000 mg | DELAYED_RELEASE_TABLET | Freq: Every day | ORAL | 0 refills | Status: DC
  Filled 2023-10-20: qty 30, 30d supply, fill #0

## 2023-10-20 MED ORDER — ONDANSETRON 8 MG PO TBDP
8.0000 mg | ORAL_TABLET | Freq: Three times a day (TID) | ORAL | 0 refills | Status: DC | PRN
  Filled 2023-10-20: qty 12, 4d supply, fill #0

## 2023-10-20 MED ORDER — LIDOCAINE VISCOUS HCL 2 % MT SOLN
15.0000 mL | Freq: Once | OROMUCOSAL | Status: AC
  Administered 2023-10-20: 15 mL via ORAL
  Filled 2023-10-20: qty 15

## 2023-10-20 MED ORDER — ALUM & MAG HYDROXIDE-SIMETH 200-200-20 MG/5ML PO SUSP
30.0000 mL | Freq: Once | ORAL | Status: AC
  Administered 2023-10-20: 30 mL via ORAL
  Filled 2023-10-20: qty 30

## 2023-10-20 MED ORDER — IOHEXOL 300 MG/ML  SOLN
100.0000 mL | Freq: Once | INTRAMUSCULAR | Status: AC | PRN
  Administered 2023-10-20: 100 mL via INTRAVENOUS

## 2023-10-20 MED ORDER — OXYCODONE-ACETAMINOPHEN 5-325 MG PO TABS
1.0000 | ORAL_TABLET | Freq: Four times a day (QID) | ORAL | 0 refills | Status: DC | PRN
  Filled 2023-10-20: qty 15, 4d supply, fill #0

## 2023-10-20 MED ORDER — OXYCODONE-ACETAMINOPHEN 5-325 MG PO TABS
1.0000 | ORAL_TABLET | ORAL | Status: DC | PRN
  Administered 2023-10-20: 1 via ORAL
  Filled 2023-10-20: qty 1

## 2023-10-20 NOTE — Discharge Instructions (Signed)
Take the medications as needed for pain and discomfort.  Follow-up with a general surgeon as we discussed to be rechecked.  Call their office to schedule an appointment.  Return to the emergency room if you have trouble with persistent pain fever vomiting or other concerns.    Try a low-fat diet as we discussed to reduce recurrent biliary colic

## 2023-10-20 NOTE — ED Provider Notes (Signed)
Patient initially seen by Dr. Judd Lien.  Please see his note.  Patient presented the ED for evaluation of abdominal pain.  CT scan was pending at theTime of shift change.  CT scan consistent with gallstones without evidence of cholecystitis.  Patient indicates that she still having pressure discomfort in her upper abdomen Physical Exam  BP (!) 159/100   Pulse 80   Temp 98.2 F (36.8 C) (Oral)   Resp (!) 22   Ht 1.651 m (5\' 5" )   Wt (!) 147.4 kg   LMP  (LMP Unknown)   SpO2 98%   BMI 54.08 kg/m   Physical Exam Vitals and nursing note reviewed.  Constitutional:      General: She is not in acute distress.    Appearance: She is well-developed.  Eyes:     Conjunctiva/sclera: Conjunctivae normal.  Cardiovascular:     Rate and Rhythm: Normal rate.  Pulmonary:     Effort: Pulmonary effort is normal.  Abdominal:     General: There is no distension.     Tenderness: There is abdominal tenderness in the right upper quadrant and epigastric area. There is no guarding.  Neurological:     Mental Status: She is alert.     Cranial Nerves: No dysarthria or facial asymmetry.     Procedures  Procedures  ED Course / MDM   Clinical Course as of 10/20/23 1019  Thu Oct 20, 2023  1610 CT scan shows evidence of cholelithiasis but no evidence of cholecystitis.  Mild dilatation of the common bile duct noted [JK]  0759 With persistent discomfort we will proceed with ultrasound to evaluate further for acute cholecystitis [JK]  0957 Ultrasound shows evidence of small to moderate volume cholelithiasis without acute cholecystitis.  Patient has slightly dilated common bile duct measuring up to 7 mm.  LFTs however are normal without evidence of hyperbilirubinemia or pancreatitis [JK]    Clinical Course User Index [JK] Linwood Dibbles, MD   Medical Decision Making Amount and/or Complexity of Data Reviewed Labs: ordered. Radiology: ordered.  Risk OTC drugs. Prescription drug management.   Patient's  ultrasound does not show evidence of cholecystitis.  There is common bile duct mild dilatation noted however the patient does not have any evidence of hyperbilirubinemia or pancreatitis to suggest common bile duct obstruction.  Patient is not have any focal tenderness at this time.  Will discharge home with a course of medications for pain and discomfort.  Discussed returning to the emergency room if she has worsening pain or discomfort.  At this time doubt cholecystitis but she does appear to be having episodes of symptomatic cholelithiasis.  Discussed outpatient follow-up with general surgery and low-fat diet       Linwood Dibbles, MD 10/20/23 1020

## 2023-10-20 NOTE — ED Notes (Signed)
ED Provider at bedside. 

## 2023-10-20 NOTE — ED Notes (Signed)
Pt given discharge instructions and reviewed prescriptions. Opportunities given for questions. Pt verbalizes understanding. PIV removed x1. Stone,Heather R, RN 

## 2023-10-20 NOTE — ED Provider Notes (Signed)
[place] EMERGENCY DEPARTMENT AT [facility] Provider Note   CSN: [id number] Arrival date & time: 10/20/23  0344     History  No chief complaint on file.   Tiffany Kirk is a 33 y.o. female.  Patient is a 33 year old female with no significant past medical history.  Patient presenting today with complaints of abdominal discomfort.  She describes a several week history of intermittent pains in her epigastric region that have come and gone.  The pain started again yesterday morning and has gradually worsened.  She has not been able to sleep tonight due to the discomfort.  She describes a burning sensation to the epigastric region that is worse when she moves or changes position.  No nausea or vomiting.  No diarrhea or constipation.  No alleviating factors.  The history is provided by the patient.       Home Medications Prior to Admission medications   Medication Sig Start Date End Date Taking? Authorizing Provider  phentermine (ADIPEX-P) 37.5 MG tablet Take 37.5 mg by mouth daily. 11/11/20   [provider]  phentermine (ADIPEX-P) 37.5 MG tablet 1 tablet 10/17/20   [provider]      Allergies    Strawberry flavoring agent (non-screening)    Review of Systems   Review of Systems  All other systems reviewed and are negative.   Physical Exam Updated Vital Signs BP (!) 167/99 (BP Location: Right Arm)   Pulse 84   Temp 98.2 F (36.8 C) (Oral)   Resp 20   Ht 5\' 5"  (1.651 m)   Wt (!) 147.4 kg   LMP  (LMP Unknown)   SpO2 100%   BMI 54.08 kg/m  Physical Exam Vitals and nursing note reviewed.  Constitutional:      General: She is not in acute distress.    Appearance: She is well-developed. She is not diaphoretic.  HENT:     Head: Normocephalic and atraumatic.  Cardiovascular:     Rate and Rhythm: Normal rate and regular rhythm.     Heart sounds: No murmur heard.    No friction rub. No gallop.  Pulmonary:     Effort: Pulmonary effort is  normal. No respiratory distress.     Breath sounds: Normal breath sounds. No wheezing.  Abdominal:     General: Bowel sounds are normal. There is no distension.     Palpations: Abdomen is soft.     Tenderness: There is abdominal tenderness. There is no guarding or rebound.     Comments: There is tenderness to palpation in the epigastric region.  Musculoskeletal:        General: Normal range of motion.     Cervical back: Normal range of motion and neck supple.  Skin:    General: Skin is warm and dry.  Neurological:     General: No focal deficit present.     Mental Status: She is alert and oriented to person, place, and time.     ED Results / Procedures / Treatments   Labs (all labs ordered are listed, but only abnormal results are displayed) Labs Reviewed  COMPREHENSIVE METABOLIC PANEL - Abnormal; Notable for the following components:      Result Value   Glucose, Bld 112 (*)    All other components within normal limits  CBC - Abnormal; Notable for the following components:   RBC 5.17 (*)    Platelets 419 (*)    All other components within normal limits  LIPASE, BLOOD  URINALYSIS, ROUTINE W REFLEX MICROSCOPIC  PREGNANCY, URINE    EKG EKG Interpretation Date/Time:  Thursday October 20 2023 04:06:06 EST Ventricular Rate:  81 PR Interval:  174 QRS Duration:  90 QT Interval:  386 QTC Calculation: 448 R Axis:   71  Text Interpretation: Normal sinus rhythm Nonspecific T wave abnormality Abnormal ECG No previous ECGs available Confirmed by Geoffery Lyons [id number]) on 10/20/2023 4:17:15 AM  Radiology No results found.  Procedures Procedures    Medications Ordered in ED Medications  oxyCODONE-acetaminophen (PERCOCET/ROXICET) 5-325 MG per tablet 1 tablet (1 tablet Oral Given 10/20/23 0420)  alum & mag hydroxide-simeth (MAALOX/MYLANTA) 200-200-20 MG/5ML suspension 30 mL (has no administration in time range)    And  lidocaine (XYLOCAINE) 2 % viscous mouth solution 15 mL (has  no administration in time range)    ED Course/ Medical Decision Making/ A&P Clinical Course as of 10/21/23 0131  Thu Oct 20, 2023  0746 CT scan shows evidence of cholelithiasis but no evidence of cholecystitis.  Mild dilatation of the common bile duct noted [JK]  0759 With persistent discomfort we will proceed with ultrasound to evaluate further for acute cholecystitis [JK]  0957 Ultrasound shows evidence of small to moderate volume cholelithiasis without acute cholecystitis.  Patient has slightly dilated common bile duct measuring up to 7 mm.  LFTs however are normal without evidence of hyperbilirubinemia or pancreatitis [JK]    Clinical Course User Index [JK] Linwood Dibbles, MD    Final Clinical Impression(s) / ED Diagnoses Final diagnoses:  None   This patient is a 33 year old female presenting with abdominal pain as described in the HPI.  She is tender in the epigastrium, but physical examination otherwise unremarkable.  Vitals are stable and patient is afebrile.  Laboratory studies obtained including CBC, CMP, and lipase.  All of these are basically unremarkable with no leukocytosis, no liver or pancreatic enzyme elevation, and no electrolyte derangement.  Urinalysis is clear and urine pregnancy test is negative.  Patient has received morphine for pain and Zofran for nausea along with IV fluids.  She will undergo a CT scan of the abdomen and pelvis with care signed out at shift change to Dr. Lynelle Doctor.  He will obtain the results of the CT scan and determine the final disposition.  Rx / DC Orders ED Discharge Orders     None         Geoffery Lyons, MD 10/21/23 [phone]

## 2023-10-20 NOTE — ED Triage Notes (Signed)
Pt states epigastric pain and pressure, states feels like trapped gas, usually goes away after drinking something.

## 2023-11-16 ENCOUNTER — Encounter (INDEPENDENT_AMBULATORY_CARE_PROVIDER_SITE_OTHER): Payer: Self-pay | Admitting: Physician Assistant

## 2023-12-15 ENCOUNTER — Encounter (INDEPENDENT_AMBULATORY_CARE_PROVIDER_SITE_OTHER): Payer: Self-pay | Admitting: Internal Medicine

## 2023-12-15 ENCOUNTER — Ambulatory Visit (INDEPENDENT_AMBULATORY_CARE_PROVIDER_SITE_OTHER): Payer: Self-pay | Admitting: Internal Medicine

## 2023-12-15 VITALS — BP 121/75 | HR 79 | Temp 98.8°F | Ht 65.0 in | Wt 331.0 lb

## 2023-12-15 DIAGNOSIS — R7303 Prediabetes: Secondary | ICD-10-CM | POA: Diagnosis not present

## 2023-12-15 DIAGNOSIS — Z6841 Body Mass Index (BMI) 40.0 and over, adult: Secondary | ICD-10-CM

## 2023-12-15 DIAGNOSIS — E66813 Obesity, class 3: Secondary | ICD-10-CM | POA: Diagnosis not present

## 2023-12-15 DIAGNOSIS — R638 Other symptoms and signs concerning food and fluid intake: Secondary | ICD-10-CM | POA: Insufficient documentation

## 2023-12-15 DIAGNOSIS — E782 Mixed hyperlipidemia: Secondary | ICD-10-CM | POA: Diagnosis not present

## 2023-12-15 NOTE — Assessment & Plan Note (Signed)
 I reviewed records from [facility] physicians in Care Everywhere, she had a fasting blood glucose of 101.  No A1c was found she also has elevated triglycerides are likely has insulin resistance.  She has acanthosis nigricans.  We will check fasting insulin levels and hemoglobin A1c with intake labs.  She would likely benefit from a low-carb moderate to high protein meal plan.  May also be a candidate for pharmacoprophylaxis with metformin.

## 2023-12-15 NOTE — Assessment & Plan Note (Signed)
 She has increased orexigenic signaling, impaired satiety and inhibitory control. This is secondary to an abnormal energy regulation system and pathological neurohormonal pathways characteristic of excess adiposity.  She may also have leptin resistance and insulin resistance.  In addition to nutritional and behavioral strategies she may benefit from pharmacotherapy and cognitive behavioral therapy.  Will assess for binge eating disorder at intake.

## 2023-12-15 NOTE — Assessment & Plan Note (Signed)
 She had elevated triglycerides in the mid 200s with normal HDL and LDL cholesterol this is likely nutritional and may improve with a reduction in simple and added sugars in her diet.  We will repeat a fasting lipid profile with intake labs.

## 2023-12-15 NOTE — Assessment & Plan Note (Signed)
 Patient is at problem with obesity since she was young suspicious for genetic obesity.  She has strong orexigenic signaling as well and was inquiring about antiobesity medications.  Patient was counseled on the 4 pillars of a comprehensive weight management plan and our approach to weight management.  She was referred to [place] from another weight management clinic and had been on phentermine in the past on and off.  We reviewed anthropometrics, biometrics, associated medical conditions and contributing factors with patient. she would benefit from a medically tailored reduced calorie nutrional plan based on her REE (resting energy expenditure), which will be determined by indirect calorimetry.  We will also assess for cardiometabolic risk and nutritional derangements via fasting labs at intake appointment.

## 2023-12-15 NOTE — Progress Notes (Signed)
 Office: [phone]  /  Fax: [phone]   Initial Visit  Tiffany Kirk was seen in clinic today to evaluate for obesity. She is interested in losing weight to improve overall health and reduce the risk of weight related complications. She presents today to review program treatment options, initial physical assessment, and evaluation.     She was referred by: Self-Referral, was referred by [facility]  When asked what else they would like to accomplish? She states: Adopt healthier eating patterns, Improve existing medical conditions, Improve quality of life, and Improve self-confidence  When asked how has your weight affected you? She states: Has affected self-esteem, Contributed to medical problems, Having fatigue, and Having poor endurance  Weight history: Had weight problems since she was small, gained weight gradually  Some associated conditions: Hyperlipidemia and Prediabetes  Contributing factors: Moderate to high levels of stress, Reduced physical activity, Eating patterns, and Strong orexigenic signaling and inadequate inhibitory control   Weight promoting medications identified: None  Current nutrition plan: None  Current level of physical activity: None, work in plant in [place]  Current or previous pharmacotherapy: Phentermine on and off in HS  Response to medication: Lost weight initially but was unable to sustain weight loss   Past medical history includes:  History reviewed. No pertinent past medical history.   Objective:   BP 121/75   Pulse 79   Temp 98.8 F (37.1 C)   Ht 5\' 5"  (1.651 m)   Wt (!) 331 lb (150.1 kg)   SpO2 97%   BMI 55.08 kg/m  She was weighed on the bioimpedance scale: Body mass index is 55.08 kg/m.  Peak Weight:331 , Body Fat%:54, Visceral Fat Rating:20, Weight trend over the last 12 months: Increasing  General:  Alert, oriented and cooperative. Patient is in no acute distress.  Respiratory: Normal respiratory  effort, no problems with respiration noted   Gait: able to ambulate independently  Mental Status: Normal mood and affect. Normal behavior. Normal judgment and thought content.   DIAGNOSTIC DATA REVIEWED:  BMET    Component Value Date/Time   NA 136 10/20/2023 0413   K 4.2 10/20/2023 0413   CL 102 10/20/2023 0413   CO2 26 10/20/2023 0413   GLUCOSE 112 (H) 10/20/2023 0413   BUN 7 10/20/2023 0413   CREATININE 0.72 10/20/2023 0413   CALCIUM 9.3 10/20/2023 0413   GFRNONAA >60 10/20/2023 0413   No results found for: "HGBA1C" No results found for: "INSULIN" CBC    Component Value Date/Time   WBC 10.1 10/20/2023 0413   RBC 5.17 (H) 10/20/2023 0413   HGB 13.9 10/20/2023 0413   HCT 41.7 10/20/2023 0413   PLT 419 (H) 10/20/2023 0413   MCV 80.7 10/20/2023 0413   MCH 26.9 10/20/2023 0413   MCHC 33.3 10/20/2023 0413   RDW 12.9 10/20/2023 0413   Iron/TIBC/Ferritin/ %Sat No results found for: "IRON", "TIBC", "FERRITIN", "IRONPCTSAT" Lipid Panel  No results found for: "CHOL", "TRIG", "HDL", "CHOLHDL", "VLDL", "LDLCALC", "LDLDIRECT" Hepatic Function Panel     Component Value Date/Time   PROT 7.1 10/20/2023 0413   ALBUMIN 4.2 10/20/2023 0413   AST 21 10/20/2023 0413   ALT 21 10/20/2023 0413   ALKPHOS 57 10/20/2023 0413   BILITOT 0.4 10/20/2023 0413   No results found for: "TSH"   Assessment and Plan:   Mixed hyperlipidemia Assessment & Plan: She had elevated triglycerides in the mid 200s with normal HDL and LDL cholesterol this is likely nutritional and may improve with  a reduction in simple and added sugars in her diet.  We will repeat a fasting lipid profile with intake labs.   Prediabetes Assessment & Plan: I reviewed records from [facility] physicians in Care Everywhere, she had a fasting blood glucose of 101.  No A1c was found she also has elevated triglycerides are likely has insulin resistance.  She has acanthosis nigricans.  We will check fasting insulin levels and  hemoglobin A1c with intake labs.  She would likely benefit from a low-carb moderate to high protein meal plan.  May also be a candidate for pharmacoprophylaxis with metformin.   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  She has strong orexigenic signaling as well and was inquiring about antiobesity medications.  Patient was counseled on the 4 pillars of a comprehensive weight management plan and our approach to weight management.  She was referred to [place] from another weight management clinic and had been on phentermine in the past on and off.  We reviewed anthropometrics, biometrics, associated medical conditions and contributing factors with patient. she would benefit from a medically tailored reduced calorie nutrional plan based on her REE (resting energy expenditure), which will be determined by indirect calorimetry.  We will also assess for cardiometabolic risk and nutritional derangements via fasting labs at intake appointment.     Abnormal food appetite Assessment & Plan: She has increased orexigenic signaling, impaired satiety and inhibitory control. This is secondary to an abnormal energy regulation system and pathological neurohormonal pathways characteristic of excess adiposity.  She may also have leptin resistance and insulin resistance.  In addition to nutritional and behavioral strategies she may benefit from pharmacotherapy and cognitive behavioral therapy.  Will assess for binge eating disorder at intake.          Obesity Treatment / Action Plan:  Patient will work on garnering support from family and friends to begin weight loss journey. Will work on eliminating or reducing the presence of highly palatable, calorie dense foods in the home. Will complete provided nutritional and  psychosocial assessment questionnaire before the next appointment. Will be scheduled for indirect calorimetry to determine resting energy expenditure in a fasting state.  This will allow [place] to create a reduced calorie, high-protein meal plan to promote loss of fat mass while preserving muscle mass. Counseled on the health benefits of losing 5%-15% of total body weight. Was counseled on nutritional approaches to weight loss and benefits of reducing processed foods and consuming plant-based foods and high quality protein as part of nutritional weight management. Was counseled on pharmacotherapy and role as an adjunct in weight management.   Obesity Education Performed Today:  She was weighed on the bioimpedance scale and results were discussed and documented in the synopsis.  We discussed obesity as a disease and the importance of a more detailed evaluation of all the factors contributing to the disease.  We discussed the importance of long term lifestyle changes which include nutrition, exercise and behavioral modifications as well as the importance of customizing this to her specific health and social needs.  We discussed the benefits of reaching a healthier weight to alleviate the symptoms of existing conditions and reduce the risks of the biomechanical, metabolic and psychological effects of obesity.  Jimmye Norman appears to be in the action stage of change and states  they are ready to start intensive lifestyle modifications and behavioral modifications.  45 minutes was spent today on this visit including the above counseling, pre-visit chart review, and post-visit documentation.  Reviewed by clinician on day of visit: allergies, medications, problem list, medical history, surgical history, family history, social history, and previous encounter notes pertinent to obesity diagnosis.   Worthy Rancher, MD

## 2024-03-29 ENCOUNTER — Other Ambulatory Visit (HOSPITAL_COMMUNITY): Payer: Self-pay

## 2024-05-03 DIAGNOSIS — Z23 Encounter for immunization: Secondary | ICD-10-CM | POA: Diagnosis not present

## 2024-05-03 DIAGNOSIS — Z9189 Other specified personal risk factors, not elsewhere classified: Secondary | ICD-10-CM | POA: Diagnosis not present

## 2024-05-03 DIAGNOSIS — Z79899 Other long term (current) drug therapy: Secondary | ICD-10-CM | POA: Diagnosis not present

## 2024-05-03 DIAGNOSIS — F1220 Cannabis dependence, uncomplicated: Secondary | ICD-10-CM | POA: Diagnosis not present

## 2024-05-03 DIAGNOSIS — R932 Abnormal findings on diagnostic imaging of liver and biliary tract: Secondary | ICD-10-CM | POA: Diagnosis not present

## 2024-05-03 DIAGNOSIS — Z0189 Encounter for other specified special examinations: Secondary | ICD-10-CM | POA: Diagnosis not present

## 2024-05-03 DIAGNOSIS — R7303 Prediabetes: Secondary | ICD-10-CM | POA: Diagnosis not present

## 2024-05-15 ENCOUNTER — Other Ambulatory Visit: Payer: Self-pay | Admitting: Family Medicine

## 2024-05-15 DIAGNOSIS — R932 Abnormal findings on diagnostic imaging of liver and biliary tract: Secondary | ICD-10-CM

## 2024-05-18 ENCOUNTER — Encounter: Payer: Self-pay | Admitting: Family Medicine

## 2024-05-31 ENCOUNTER — Other Ambulatory Visit

## 2024-06-08 ENCOUNTER — Ambulatory Visit
Admission: RE | Admit: 2024-06-08 | Discharge: 2024-06-08 | Disposition: A | Discharge status: Home / Self Care | Source: Ambulatory Visit | Attending: Family Medicine | Admitting: Family Medicine

## 2024-06-08 DIAGNOSIS — R932 Abnormal findings on diagnostic imaging of liver and biliary tract: Secondary | ICD-10-CM

## 2024-06-15 ENCOUNTER — Encounter: Payer: Self-pay | Admitting: Family Medicine

## 2024-06-22 ENCOUNTER — Ambulatory Visit
Admission: RE | Admit: 2024-06-22 | Discharge: 2024-06-22 | Disposition: A | Discharge status: Home / Self Care | Source: Ambulatory Visit | Attending: Family Medicine | Admitting: Family Medicine

## 2024-06-22 ENCOUNTER — Other Ambulatory Visit (HOSPITAL_COMMUNITY): Payer: Self-pay

## 2024-06-22 DIAGNOSIS — K8020 Calculus of gallbladder without cholecystitis without obstruction: Secondary | ICD-10-CM | POA: Diagnosis not present

## 2024-06-22 MED ORDER — GADOPICLENOL 0.5 MMOL/ML IV SOLN
10.0000 mL | Freq: Once | INTRAVENOUS | Status: AC | PRN
Start: 2024-06-22 — End: 2024-06-22
  Administered 2024-06-22: 10 mL via INTRAVENOUS

## 2024-08-23 ENCOUNTER — Other Ambulatory Visit (HOSPITAL_COMMUNITY): Payer: Self-pay

## 2024-08-23 DIAGNOSIS — Z23 Encounter for immunization: Secondary | ICD-10-CM | POA: Diagnosis not present

## 2024-08-23 DIAGNOSIS — M25562 Pain in left knee: Secondary | ICD-10-CM | POA: Diagnosis not present

## 2024-08-23 DIAGNOSIS — Z1389 Encounter for screening for other disorder: Secondary | ICD-10-CM | POA: Diagnosis not present
